# Patient Record
Sex: Male | Born: 1985 | Race: White | Hispanic: No | Marital: Married | Smoking: Former smoker
Health system: Southern US, Community
[De-identification: ages and names within clinical notes are randomized; demographics above are authoritative.]

## PROBLEM LIST (undated history)

## (undated) DIAGNOSIS — R569 Unspecified convulsions: Secondary | ICD-10-CM

## (undated) DIAGNOSIS — M549 Dorsalgia, unspecified: Secondary | ICD-10-CM

## (undated) DIAGNOSIS — G8929 Other chronic pain: Secondary | ICD-10-CM

## (undated) DIAGNOSIS — F419 Anxiety disorder, unspecified: Secondary | ICD-10-CM

## (undated) DIAGNOSIS — Z8619 Personal history of other infectious and parasitic diseases: Secondary | ICD-10-CM

## (undated) HISTORY — DX: Other chronic pain: G89.29

## (undated) HISTORY — DX: Dorsalgia, unspecified: M54.9

## (undated) HISTORY — DX: Personal history of other infectious and parasitic diseases: Z86.19

## (undated) HISTORY — DX: Anxiety disorder, unspecified: F41.9

## (undated) HISTORY — DX: Unspecified convulsions: R56.9

---

## 2013-09-26 ENCOUNTER — Emergency Department (HOSPITAL_COMMUNITY): Admission: EM | Admit: 2013-09-26 | Discharge: 2013-09-26 | Discharge status: Against Medical Advice | Payer: Self-pay

## 2014-10-30 ENCOUNTER — Other Ambulatory Visit (HOSPITAL_COMMUNITY): Payer: Self-pay | Admitting: Gastroenterology

## 2014-10-30 DIAGNOSIS — B182 Chronic viral hepatitis C: Secondary | ICD-10-CM

## 2014-11-08 ENCOUNTER — Ambulatory Visit (HOSPITAL_COMMUNITY): Payer: Self-pay

## 2014-11-14 ENCOUNTER — Ambulatory Visit (HOSPITAL_COMMUNITY): Payer: Self-pay

## 2014-11-26 ENCOUNTER — Ambulatory Visit (HOSPITAL_COMMUNITY)
Admission: RE | Admit: 2014-11-26 | Discharge: 2014-11-26 | Disposition: A | Discharge status: Home / Self Care | Payer: Managed Care, Other (non HMO) | Source: Ambulatory Visit | Attending: Gastroenterology | Admitting: Gastroenterology

## 2014-11-26 DIAGNOSIS — B182 Chronic viral hepatitis C: Secondary | ICD-10-CM | POA: Diagnosis present

## 2015-10-29 ENCOUNTER — Ambulatory Visit (INDEPENDENT_AMBULATORY_CARE_PROVIDER_SITE_OTHER): Payer: BLUE CROSS/BLUE SHIELD | Admitting: Neurology

## 2015-10-29 ENCOUNTER — Encounter: Payer: Self-pay | Admitting: Neurology

## 2015-10-29 ENCOUNTER — Ambulatory Visit: Payer: BLUE CROSS/BLUE SHIELD | Admitting: Neurology

## 2015-10-29 VITALS — BP 133/96 | HR 80 | Ht 70.0 in | Wt 188.0 lb

## 2015-10-29 DIAGNOSIS — Z8669 Personal history of other diseases of the nervous system and sense organs: Secondary | ICD-10-CM

## 2015-10-29 DIAGNOSIS — Z87898 Personal history of other specified conditions: Secondary | ICD-10-CM | POA: Insufficient documentation
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
## 2015-11-05 ENCOUNTER — Ambulatory Visit (INDEPENDENT_AMBULATORY_CARE_PROVIDER_SITE_OTHER): Payer: BLUE CROSS/BLUE SHIELD | Admitting: Neurology

## 2015-11-05 DIAGNOSIS — G4089 Other seizures: Secondary | ICD-10-CM

## 2015-11-05 DIAGNOSIS — Z87898 Personal history of other specified conditions: Secondary | ICD-10-CM

## 2015-11-14 ENCOUNTER — Telehealth: Payer: Self-pay | Admitting: Neurology

## 2015-11-14 NOTE — Telephone Encounter (Signed)
Patient called to request results of recent EEG

## 2015-11-14 NOTE — Procedures (Signed)
   HISTORY: 29 years old male, with history of seizure   TECHNIQUE:  16 channel EEG was performed based on standard 10-16 international system. One channel was dedicated to EKG, which has demonstrates normal sinus rhythm of  84 beats per minutes.  Upon awakening, the posterior background activity was well-developed, in alpha range,  9 Hz,  reactive to eye opening and closure.  There was no evidence of epileptiform discharge.  Photic stimulation was performed, which induced a symmetric photic driving.  Hyperventilation was performed, there was no abnormality elicit.  Stage II  sleep was achieved, as evident by K complexes, and sleep spindles.   CONCLUSION: This is a  normal  awake and asleep EEG.  There is no electrodiagnostic evidence of epileptiform discharge

## 2015-11-14 NOTE — Telephone Encounter (Signed)
Please call patient for normal EEG 

## 2015-11-14 NOTE — Telephone Encounter (Signed)
I called pt and relayed that the EEG is normal study.  He verbalized understanding.

## 2015-12-20 IMAGING — US US ABDOMEN COMPLETE W/ ELASTOGRAPHY
1 series · 13 of 25 positions shown · non-contrast
Comparison: None.

CLINICAL DATA: Chronic hepatitis-C without coma.



[Series 1: us abdomen complete w/ elastography · 0.27mm/px · 13 of 70 slices shown]
[im 1/70]
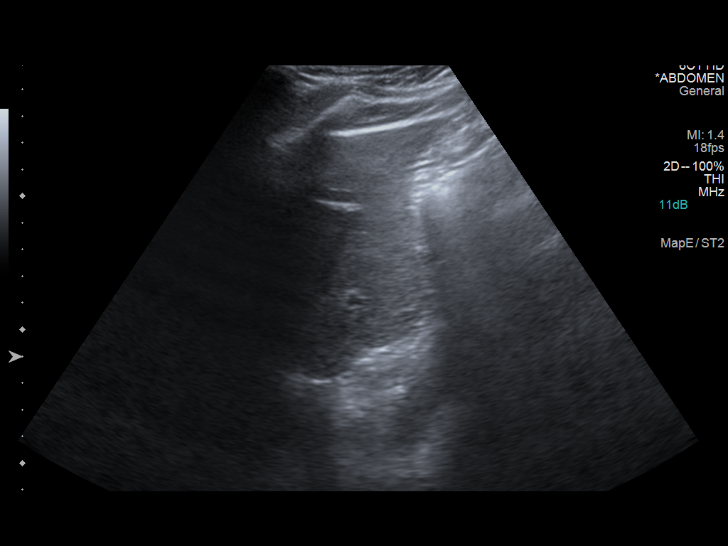
[im 6/70]
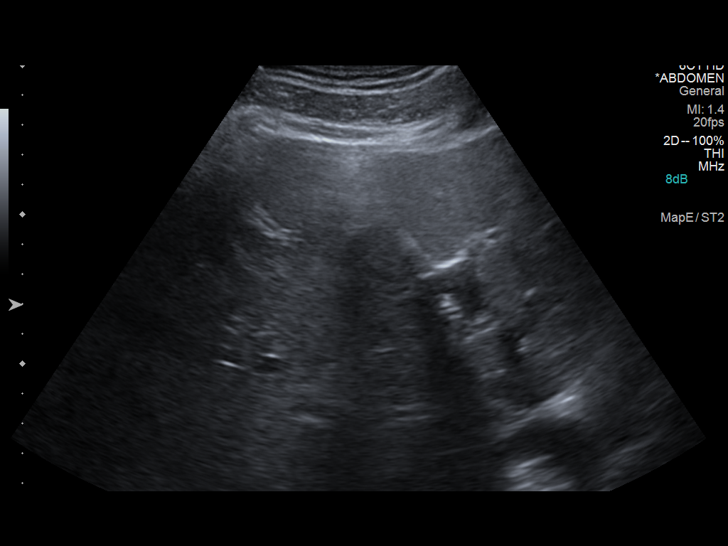
[im 12/70]
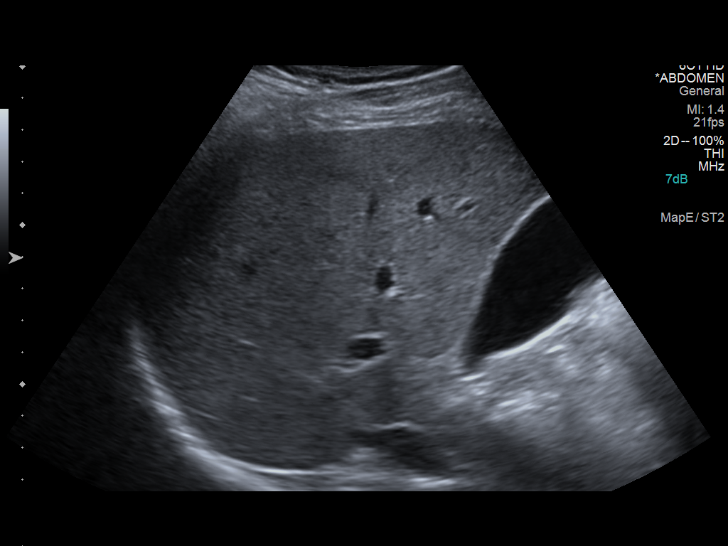
[im 18/70]
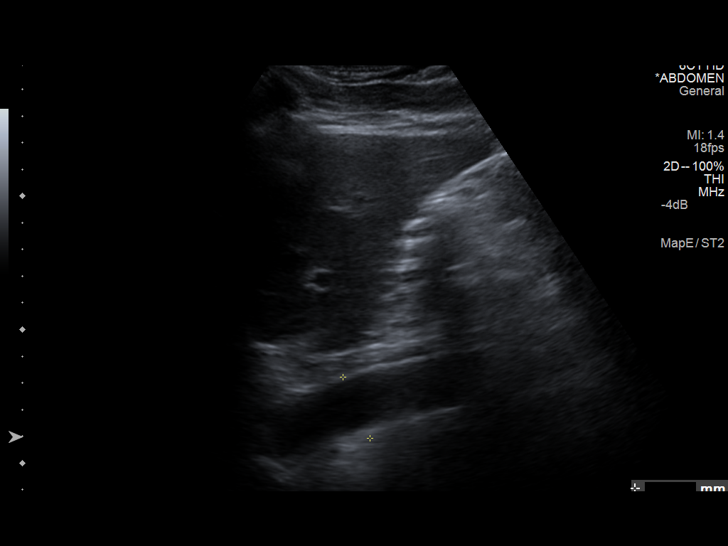
[im 24/70]
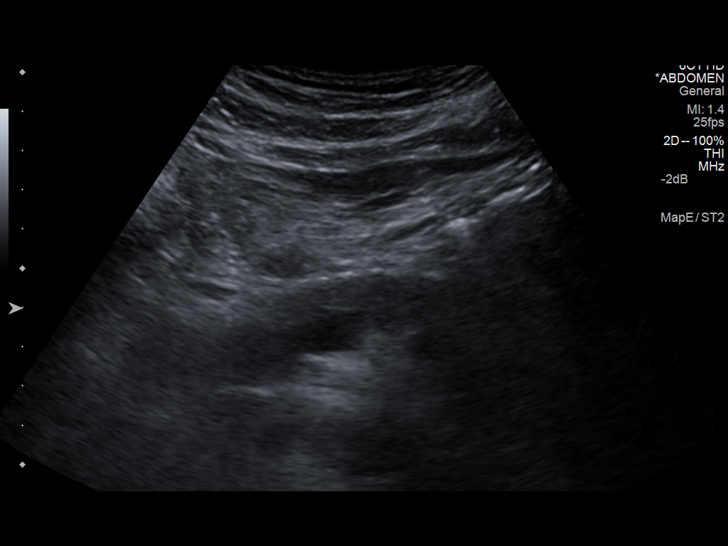
[im 29/70]
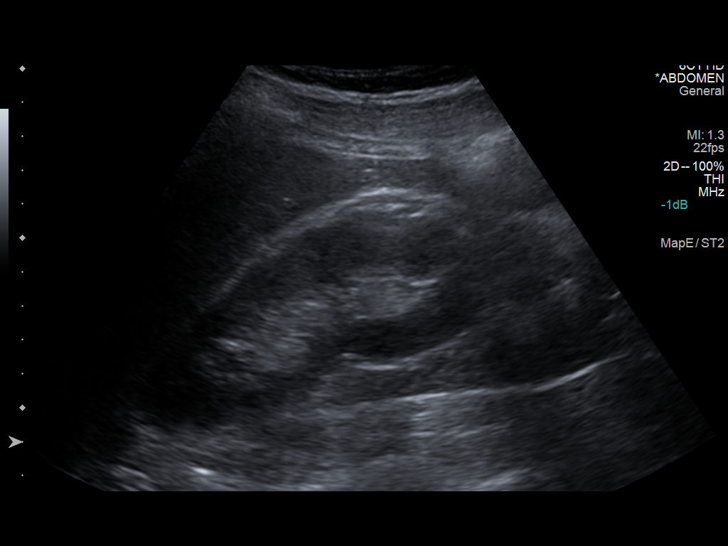
[im 35/70]
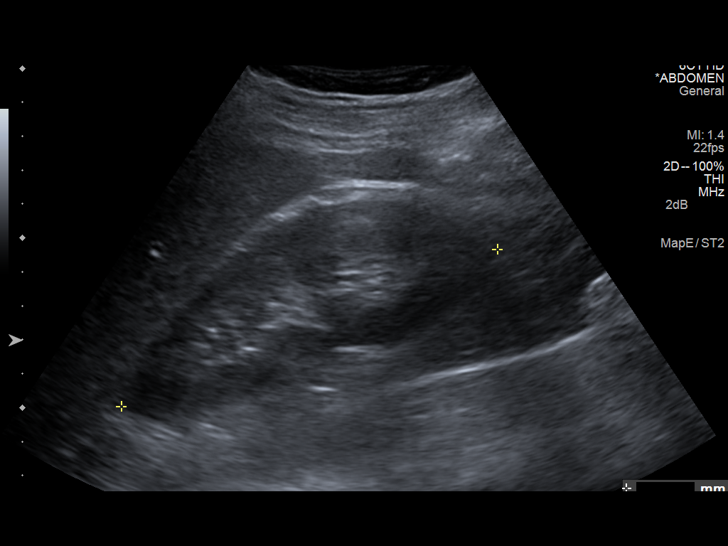
[im 41/70]
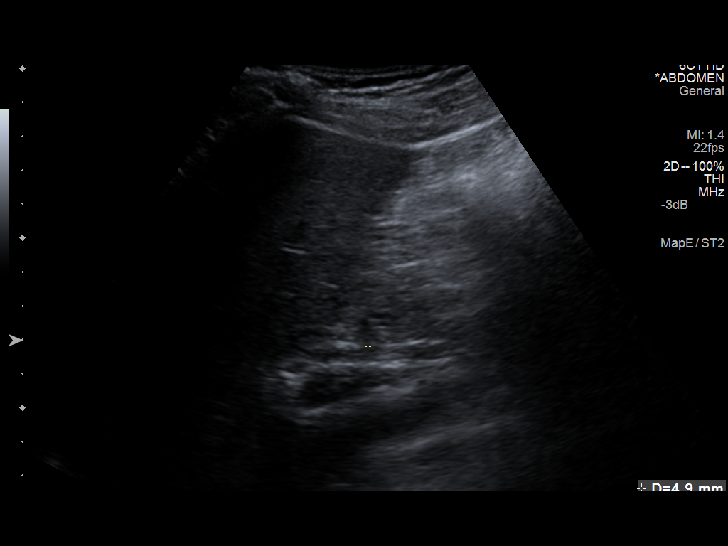
[im 47/70]
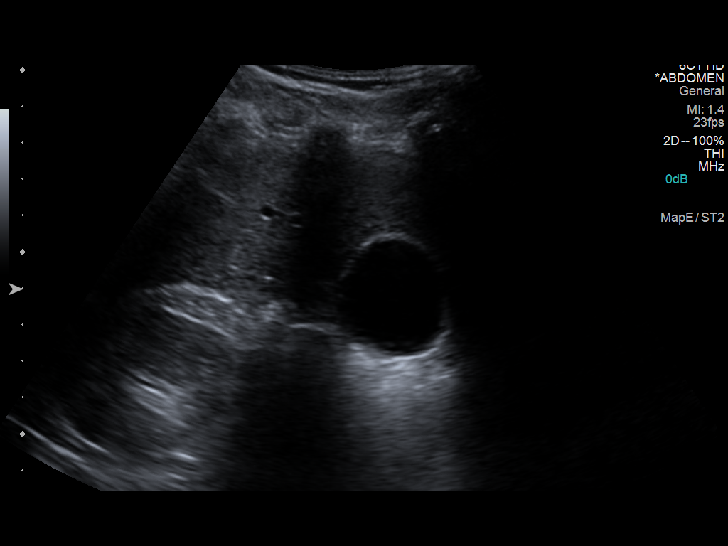
[im 52/70]
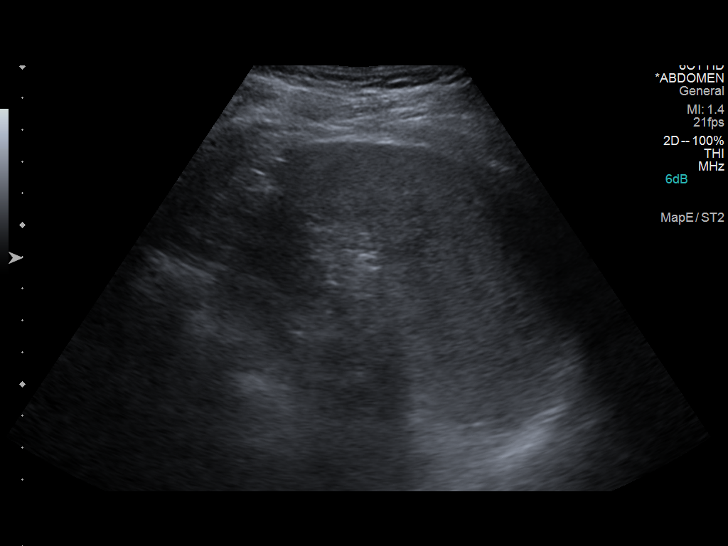
[im 58/70]
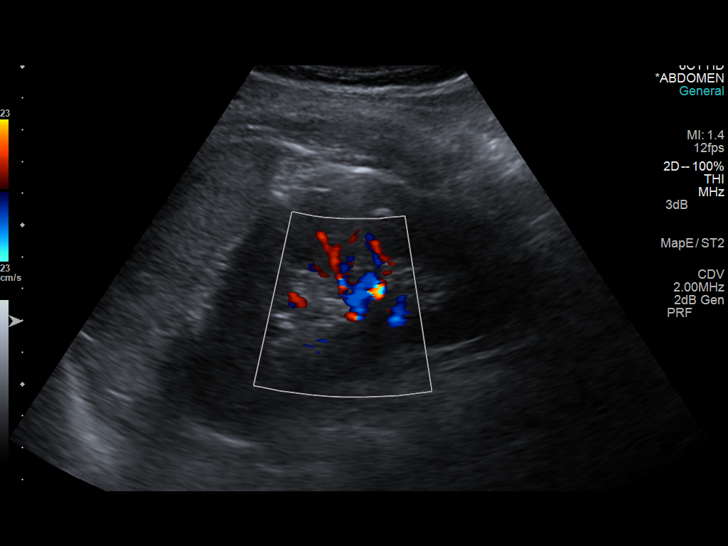
[im 64/70]
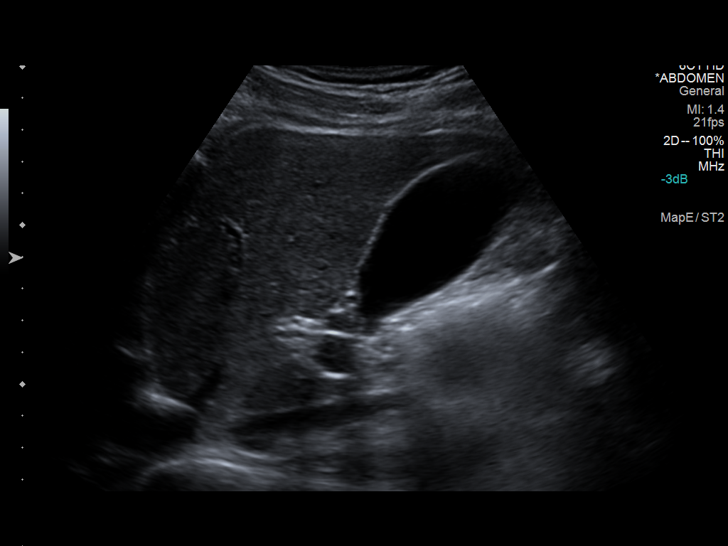
[im 70/70]
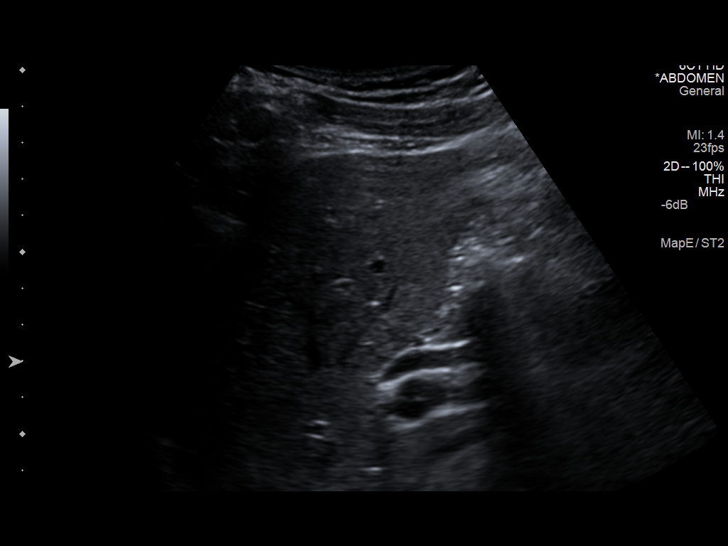

[13 of 25 positions shown; findings below may reference images not displayed]

FINDINGS: ULTRASOUND ABDOMEN

Gallbladder: No gallstones or wall thickening visualized. No
sonographic Murphy sign noted.

Common bile duct: Diameter: 5 mm

Liver: No focal lesion identified. Within normal limits in
parenchymal echogenicity.

IVC: No abnormality visualized.

Pancreas: Visualized portion unremarkable.

Spleen: Size and appearance within normal limits.

Right Kidney: Length: 12.0 cm. Echogenicity within normal limits. No
mass or hydronephrosis visualized.

Left Kidney: Length: 12.8 cm. Echogenicity within normal limits. No
mass or hydronephrosis visualized.

Abdominal aorta: No aneurysm visualized.

Other findings: None.

ULTRASOUND HEPATIC ELASTOGRAPHY

Device: Siemens Helix VTQ

Transducer 4V

Patient position: Left lateral decubitus

Number of measurements:  10

Hepatic Segment:  8

Median velocity:   1.14  m/sec

IQR:

IQR/Median velocity ratio

Corresponding Metavir fibrosis score:  F0/F1

Risk of fibrosis: Minimal

Limitations of exam: None

Pertinent findings noted on other imaging exams:  None

Please note that abnormal shear wave velocities may also be
identified in clinical settings other than with hepatic fibrosis,
such as: acute hepatitis, elevated right heart and central venous
pressures including use of beta blockers, Md Abdal disease
(Zeinab), infiltrative processes such as
mastocytosis/amyloidosis/infiltrative tumor, extrahepatic
cholestasis, in the post-prandial state, and liver transplantation.
Correlation with patient history, laboratory data, and clinical
condition recommended.
IMPRESSION: Negative abdominal ultrasound.

Median hepatic shear wave velocity is calculated at 1.14 m/sec.

Corresponding Metavir fibrosis score is F0/F1.

Risk of fibrosis is minimal.

Follow-up:  None required

## 2016-01-28 ENCOUNTER — Ambulatory Visit: Payer: BLUE CROSS/BLUE SHIELD | Admitting: Neurology

## 2016-01-29 ENCOUNTER — Encounter: Payer: Self-pay | Admitting: Neurology

## 2016-06-24 DIAGNOSIS — F419 Anxiety disorder, unspecified: Secondary | ICD-10-CM | POA: Diagnosis not present

## 2016-06-24 DIAGNOSIS — B1920 Unspecified viral hepatitis C without hepatic coma: Secondary | ICD-10-CM | POA: Diagnosis not present

## 2016-06-24 DIAGNOSIS — K219 Gastro-esophageal reflux disease without esophagitis: Secondary | ICD-10-CM | POA: Diagnosis not present

## 2016-12-07 DIAGNOSIS — Z0000 Encounter for general adult medical examination without abnormal findings: Secondary | ICD-10-CM | POA: Diagnosis not present

## 2017-05-28 DIAGNOSIS — K219 Gastro-esophageal reflux disease without esophagitis: Secondary | ICD-10-CM | POA: Diagnosis not present

## 2017-05-28 DIAGNOSIS — F329 Major depressive disorder, single episode, unspecified: Secondary | ICD-10-CM | POA: Diagnosis not present

## 2017-11-02 DIAGNOSIS — H6501 Acute serous otitis media, right ear: Secondary | ICD-10-CM | POA: Diagnosis not present

## 2017-11-04 DIAGNOSIS — H60331 Swimmer's ear, right ear: Secondary | ICD-10-CM | POA: Diagnosis not present

## 2017-12-23 DIAGNOSIS — Z0000 Encounter for general adult medical examination without abnormal findings: Secondary | ICD-10-CM | POA: Diagnosis not present

## 2017-12-23 DIAGNOSIS — F329 Major depressive disorder, single episode, unspecified: Secondary | ICD-10-CM | POA: Diagnosis not present

## 2017-12-23 DIAGNOSIS — K219 Gastro-esophageal reflux disease without esophagitis: Secondary | ICD-10-CM | POA: Diagnosis not present

## 2017-12-23 DIAGNOSIS — B1920 Unspecified viral hepatitis C without hepatic coma: Secondary | ICD-10-CM | POA: Diagnosis not present

## 2018-01-19 DIAGNOSIS — Z0000 Encounter for general adult medical examination without abnormal findings: Secondary | ICD-10-CM | POA: Diagnosis not present

## 2018-01-19 DIAGNOSIS — B1920 Unspecified viral hepatitis C without hepatic coma: Secondary | ICD-10-CM | POA: Diagnosis not present

## 2018-05-16 DIAGNOSIS — F3170 Bipolar disorder, currently in remission, most recent episode unspecified: Secondary | ICD-10-CM | POA: Diagnosis not present

## 2018-05-31 DIAGNOSIS — F3170 Bipolar disorder, currently in remission, most recent episode unspecified: Secondary | ICD-10-CM | POA: Diagnosis not present

## 2018-06-07 DIAGNOSIS — F3170 Bipolar disorder, currently in remission, most recent episode unspecified: Secondary | ICD-10-CM | POA: Diagnosis not present

## 2018-08-02 DIAGNOSIS — F430 Acute stress reaction: Secondary | ICD-10-CM | POA: Diagnosis not present

## 2018-09-14 DIAGNOSIS — F430 Acute stress reaction: Secondary | ICD-10-CM | POA: Diagnosis not present

## 2019-01-19 DIAGNOSIS — Z13220 Encounter for screening for lipoid disorders: Secondary | ICD-10-CM | POA: Diagnosis not present

## 2019-01-19 DIAGNOSIS — L299 Pruritus, unspecified: Secondary | ICD-10-CM | POA: Diagnosis not present

## 2019-01-31 ENCOUNTER — Encounter: Payer: Self-pay | Admitting: Allergy & Immunology

## 2019-01-31 ENCOUNTER — Ambulatory Visit: Payer: BLUE CROSS/BLUE SHIELD | Admitting: Allergy & Immunology

## 2019-01-31 VITALS — BP 142/106 | HR 92 | Temp 98.1°F | Resp 16 | Ht 70.0 in | Wt 207.6 lb

## 2019-01-31 DIAGNOSIS — L299 Pruritus, unspecified: Secondary | ICD-10-CM | POA: Diagnosis not present

## 2019-01-31 DIAGNOSIS — R21 Rash and other nonspecific skin eruption: Secondary | ICD-10-CM | POA: Diagnosis not present

## 2019-01-31 MED ORDER — CRISABOROLE 2 % EX OINT: 1.0000 "application " | TOPICAL_OINTMENT | Freq: Two times a day (BID) | CUTANEOUS | 5 refills | Status: AC

## 2019-01-31 NOTE — Patient Instructions (Addendum)
1. Rash - We are going to get some environmental allergy labs to see if there is an allergic trigger for your symptoms. - We are also going to make sure that you do not have overactive mast cells.  - I do not think that this is related to foods, but take a good history of dietary exposures to see if this worsens your symptoms at all.  - Continue with moisturizing twice daily. - Add on Eucrisa twice daily as needed for the itching.  - Consider patch testing to see if there are other chemical exposures that might worsen your skin. - Double Zyrtec (cetirizine) to one tablet twice daily.   2. Return in about 6 days (around 02/06/2019) for [facility] TESTING.   Please inform [place] of any Emergency Department visits, hospitalizations, or changes in symptoms. Call us before going to the ED for breathing or allergy symptoms since we might be able to fit you in for a sick visit. Feel free to contact us anytime with any questions, problems, or concerns.  It was a pleasure to meet you today!  Websites that have reliable patient information: 1. American Academy of Asthma, Allergy, and Immunology: www.aaaai.org 2. Food Allergy Research and Education (FARE): foodallergy.org 3. Mothers of Asthmatics: http://www.asthmacommunitynetwork.org 4. American College of Allergy, Asthma, and Immunology: MissingWeapons.ca   Make sure you are registered to vote! If you have moved or changed any of your contact information, you will need to get this updated before voting!    Voter ID laws are NOT going into effect for the General Election in November 2020! DO NOT let this stop you from exercising your right to vote!     True Test looks for the following sensitivities:

## 2019-01-31 NOTE — Addendum Note (Signed)
Addended by: Osa Craver on: 01/31/2019 11:15 AM   Modules accepted: Orders

## 2019-01-31 NOTE — Progress Notes (Signed)
NEW PATIENT  Date of Service/Encounter:  01/31/19  Referring provider: Jarrett Soho, PA-C   Assessment:   Rash and itching - unclear trigger   It is unclear what is causing Steve Morgan rash.  I do not suspect that this is an allergic rash, since he has absolutely no other symptoms of allergic rhinitis or atopic disease at all.  We are going to get an environmental allergy panel via the blood to see what could be triggering the symptoms.  We are also going to get a serum tryptase to rule out a mast cell disease.  I think something like contact dermatitis is more likely, although he has never had vesicles, so I think patch testing would be more useful for him.  He is going to get this done next Monday with Dr. Selena Batten.  I did discuss patch testing, including the fact that he should not have prednisone within several weeks of the test.  He has not used prednisone at all for this current rash.  We are going to treat the itch with Eucrisa and increased antihistamines.    Plan/Recommendations:   1. Rash - We are going to get some environmental allergy labs to see if there is an allergic trigger for your symptoms. - We are also going to make sure that you do not have overactive mast cells.  - I do not think that this is related to foods, but take a good history of dietary exposures to see if this worsens your symptoms at all.  - Continue with moisturizing twice daily. - Add on Eucrisa twice daily as needed for the itching.  - Consider patch testing to see if there are other chemical exposures that might worsen your skin. - Double Zyrtec (cetirizine) to one tablet twice daily.   2. Return in about 6 days (around 02/06/2019) for [facility] TESTING.   Subjective:   Steve Morgan is a 33 y.o. male presenting today for evaluation of  Chief Complaint  Patient presents with  . Rash    used moisturizer  . Pruritis    Steve Morgan has a history of the following: Patient Active Problem  List   Diagnosis Date Noted  . History of seizure 10/29/2015    History obtained from: chart review and patient.  Earley Brooke was referred by Jarrett Soho, PA-C.     Jemell is a 33 y.o. male presenting for an evaluation of a rash. Her reports that he has a rash on his legs. He gets dry skin during the winter but this year has been much worse. His wife switched to Gain laundry detergent. He thinks that this is what triggered the response. Then they changed laundry detergent to Oxyclean without dyes. He rewashed this all and then this is when things rapidly worsened. He feels that it is an "internal itch". He has used lotions, mositurizing, etc. His PCP recommended cetirizine which did help somewhat. There has never been a visible rash for the most part, aside from dry flaky skin. He would have some red dots with certain areas of scratching.    He was treated for hepatitis C four years ago and received a curative treatment for this.  The total was $58,000, but thankfully he had insurance.  He does have a recent complete metabolic panel 2 weeks ago, which was completely normal.  He also had a normal CBC and normal thyroid studies.  He does not think that the rash gets any worse with any particular  food.  He has not changed his diet at all.  He tolerates all of the major food allergens without adverse event.  He denies any hives, throat swelling, gastrointestinal symptoms, or other symptoms with any particular food.  He denies any tick bites.   Otherwise, there is no history of other atopic diseases, including asthma, food allergies, drug allergies, environmental allergies, stinging insect allergies or contact dermatitis. There is no significant infectious history. Vaccinations are up to date.    Past Medical History: Patient Active Problem List   Diagnosis Date Noted  . History of seizure 10/29/2015    Medication List:  Allergies as of 01/31/2019   No Known Allergies     Medication  List       Accurate as of January 31, 2019  9:23 AM. Always use your most recent med list.        cetirizine 10 MG tablet Commonly known as:  ZYRTEC Take 10 mg by mouth as needed for allergies.   gabapentin 800 MG tablet Commonly known as:  NEURONTIN 1 tablet Three times a day Orally 90 days   ibuprofen 200 MG tablet Commonly known as:  ADVIL,MOTRIN Take 200 mg by mouth as needed.   omeprazole 20 MG capsule Commonly known as:  PRILOSEC TAKE ONE CAPSULE BY MOUTH EVERY DAY FOR 90 DAYS       Birth History: non-contributory  Developmental History: non-contributory.   Past Surgical History: History reviewed. No pertinent surgical history.   Family History: Family History  Problem Relation Age of Onset  . Alcoholism Mother   . Drug abuse Father   . Heart disease Father   . Alcoholism Maternal Grandfather   . Heart attack Paternal Grandfather   . Diabetes Maternal Grandmother   . Drug abuse Brother      Social History: Nyjel lives at home with his family. He lives in a house that was built in 1968. There are hardwoods in the main living areas. He has carpeting in the rest of the home. There is gas heating and central cooling.  There are no animals inside or outside of the home.  He does not have dust mite covers on his bedding.  There is no tobacco exposure.  Currently he works for a [employer] as a Merchandiser, retail.  He has done this for 18 months.  He did smoke from 2005 through 2014, but no longer smokes at all.   Review of Systems  Constitutional: Negative.  Negative for fever, malaise/fatigue and weight loss.  HENT: Negative.  Negative for congestion, ear discharge and ear pain.   Eyes: Negative for pain, discharge and redness.  Respiratory: Negative for cough, sputum production, shortness of breath and wheezing.   Cardiovascular: Negative.  Negative for chest pain and palpitations.  Gastrointestinal: Negative for abdominal pain and heartburn.  Skin: Positive for itching  and rash.  Neurological: Negative for dizziness and headaches.  Endo/Heme/Allergies: Negative for environmental allergies. Does not bruise/bleed easily.       Objective:   Blood pressure (!) 142/106, pulse 92, temperature 98.1 F (36.7 C), temperature source Oral, resp. rate 16, height 5\' 10"  (1.778 m), weight 207 lb 9.6 oz (94.2 kg), SpO2 96 %. Body mass index is 29.79 kg/m.   Physical Exam:   Physical Exam  Constitutional: He appears well-developed.  HENT:  Head: Normocephalic and atraumatic.  Right Ear: Tympanic membrane, external ear and ear canal normal. No drainage, swelling or tenderness. Tympanic membrane is not injected, not scarred, not erythematous, not  retracted and not bulging.  Left Ear: Tympanic membrane, external ear and ear canal normal. No drainage, swelling or tenderness. Tympanic membrane is not injected, not scarred, not erythematous, not retracted and not bulging.  Nose: No mucosal edema, rhinorrhea, nasal deformity or septal deviation. No epistaxis. Right sinus exhibits no maxillary sinus tenderness and no frontal sinus tenderness. Left sinus exhibits no maxillary sinus tenderness and no frontal sinus tenderness.  Mouth/Throat: Uvula is midline and oropharynx is clear and moist. Mucous membranes are not pale and not dry.  Eyes: Pupils are equal, round, and reactive to light. Conjunctivae and EOM are normal. Right eye exhibits no chemosis and no discharge. Left eye exhibits no chemosis and no discharge. Right conjunctiva is not injected. Left conjunctiva is not injected.  Cardiovascular: Normal rate, regular rhythm and normal heart sounds.  Respiratory: Effort normal and breath sounds normal. No accessory muscle usage. No tachypnea. No respiratory distress. He has no wheezes. He has no rhonchi. He has no rales. He exhibits no tenderness.  GI: There is no abdominal tenderness. There is no rebound and no guarding.  Lymphadenopathy:       Head (right side): No  submandibular, no tonsillar and no occipital adenopathy present.       Head (left side): No submandibular, no tonsillar and no occipital adenopathy present.    He has no cervical adenopathy.  Neurological: He is alert.  Skin: No abrasion, no petechiae and no rash noted. Rash is not papular, not vesicular and not urticarial. No erythema. No pallor.  There are some excoriations on the bilateral legs as well as the arms.  Psychiatric: He has a normal mood and affect.     Diagnostic studies: none (deferred since he had an appointment)       Malachi Bonds, MD [facility]

## 2019-02-04 LAB — IGE+ALLERGENS ZONE 2(30)
AMER SYCAMORE IGE QN: 0.12 kU/L — AB
Aspergillus Fumigatus IgE: <0.1 kU/L
Bahia Grass IgE: 2.34 kU/L — AB
Cladosporium Herbarum IgE: <0.1 kU/L
Cockroach, American IgE: 0.13 kU/L — AB
Common Silver Birch IgE: <0.1 kU/L
D001-IGE D PTERONYSSINUS: 0.41 kU/L — AB
D002-IGE D FARINAE: 0.49 kU/L — AB
Dog Dander IgE: <0.1 kU/L
Elm, American IgE: <0.1 kU/L
G002-IGE BERMUDA GRASS: 1.39 kU/L — AB
Hickory, White IgE: <0.1 kU/L
IGE (IMMUNOGLOBULIN E), SERUM: 361 [IU]/mL (ref 6–495)
Johnson Grass IgE: 2.27 kU/L — AB
Maple/Box Elder IgE: 1.69 kU/L — AB
Mucor Racemosus IgE: <0.1 kU/L
Mugwort IgE Qn: <0.1 kU/L
Nettle IgE: <0.1 kU/L
Oak, White IgE: 0.11 kU/L — AB
Penicillium Chrysogen IgE: <0.1 kU/L
Pigweed, Rough IgE: <0.1 kU/L
Plantain, English IgE: 0.13 kU/L — AB
Sheep Sorrel IgE Qn: 0.13 kU/L — AB
T006-IGE CEDAR, MOUNTAIN: 0.1 kU/L — AB
Timothy Grass IgE: 4.17 kU/L — AB
W001-IGE RAGWEED, SHORT: 0.39 kU/L — AB
White Mulberry IgE: <0.1 kU/L

## 2019-02-04 LAB — TRYPTASE: Tryptase: 5.3 ug/L (ref 2.2–13.2)

## 2019-02-06 ENCOUNTER — Ambulatory Visit (INDEPENDENT_AMBULATORY_CARE_PROVIDER_SITE_OTHER): Payer: BLUE CROSS/BLUE SHIELD | Admitting: Allergy

## 2019-02-06 ENCOUNTER — Encounter: Payer: Self-pay | Admitting: Allergy

## 2019-02-06 VITALS — BP 130/82 | HR 76 | Resp 16

## 2019-02-06 DIAGNOSIS — L239 Allergic contact dermatitis, unspecified cause: Secondary | ICD-10-CM

## 2019-02-06 NOTE — Assessment & Plan Note (Addendum)
Rash and pruritus without any rhino conjunctivitis symptoms. TRUE test patches placed today.

## 2019-02-06 NOTE — Progress Notes (Signed)
   Follow Up Note  RE: Steve Morgan MRN: [id number] DOB: 1986/11/10 Date of Office Visit: 02/06/2019  Referring provider: Jarrett Soho, PA-C Primary care provider: Jarrett Soho, PA-C  History of Present Illness: I had the pleasure of seeing Steve Morgan for a follow up visit at the [facility] on 02/06/2019. He is a 33 y.o. male, who is being followed for pruritus and rash. Today he is here for patch test placement, given suspected history of contact dermatitis.   Diagnostics: TRUE Test patches placed.   Assessment and Plan: Steve Morgan is a 33 y.o. male with: Allergic contact dermatitis Rash and pruritus without any rhino conjunctivitis symptoms. TRUE test patches placed today.   The patient was instructed regarding proper care of the patches for the next 48 hours. Do not get patches wet - avoid showering until the next visit. Do not engage in vigorous physical activity.  Patient will follow up in 48 hours and 96 hours for patch readings.  It was my pleasure to see Steve Morgan today and participate in his care. Please feel free to contact me with any questions or concerns.  Sincerely,  Wyline Mood, DO Allergy & Immunology  [facility] office: [phone] [facility] office:[phone]

## 2019-02-08 ENCOUNTER — Ambulatory Visit: Payer: BLUE CROSS/BLUE SHIELD | Admitting: Allergy

## 2019-02-08 ENCOUNTER — Encounter: Payer: Self-pay | Admitting: Allergy

## 2019-02-08 ENCOUNTER — Other Ambulatory Visit: Payer: Self-pay

## 2019-02-08 DIAGNOSIS — L239 Allergic contact dermatitis, unspecified cause: Secondary | ICD-10-CM

## 2019-02-08 NOTE — Progress Notes (Signed)
   Follow Up Note  RE: Steve Morgan MRN: [id number] DOB: 1986/05/26 Date of Office Visit: 02/08/2019  Referring provider: Jarrett Soho, PA-C Primary care provider: Jarrett Soho, PA-C  History of Present Illness: I had the pleasure of seeing Steve Morgan for a follow up visit at the [facility] on 02/08/2019. He is a 33 y.o. male, who is being followed for pruritus and rash. Today he is here for initial patch test interpretation, given suspected history of contact dermatitis.   Diagnostics:  TRUE TEST 48 hour reading:  T.R.U.E. Test - 02/08/19 0900    Time Antigen Placed  0930    Manufacturer  Greer    Location  Back    Number of Test  36    Reading Interval  Day 1;Day 3    Panel  Panel 1;Panel 2;Panel 3    1. Nickel Sulfate  --   +/-   2. Wool Alcohols  0    3. Neomycin Sulfate  0    4. Potassium Dichromate  0    5. Caine Mix  0    6. Fragrance Mix  0    7. Colophony  --   +/-   8. Paraben Mix  0    9. Negative Control  0    10. Balsam of [place]  0    11. Ethylenediamine Dihydrochloride  0    12. Cobalt Dichloride  0    13. p-tert Butylphenol Formaldehyde Resin  0    14. Epoxy Resin  0    15. Carba Mix  0    16.  Black Rubber Mix  0    17. Cl+ Me-Isothiazolinone  0    18. Quaternium-15  0    19. Methyldibromo Glutaronitrile  0    20. p-Phenylenediamine  0    21. Formaldehyde  0    22. Mercapto Mix  0    23. Thimerosal  0    24. Thiuram Mix  0    25. Diazolidinyl Urea  0    26. Quinoline Mix  1    27. Tixocortol-21-Pivalate  0    28. Gold Sodium Thiosulfate  0    29. Imidazolidinyl Urea  0    30. Budesonide  0    31. Hydrocortisone-17-Butyrate  0    32. Mercaptobenzothiazole  0    33. Bacitracin  0    34. Parthenolide  0    35. Disperse Blue 106  0    36. 2-Bromo-2-Nitropropane-1,3-diol  0        Assessment and Plan: Steve Morgan is a 33 y.o. male with: Allergic contact dermatitis Rash and pruritus without any rhino conjunctivitis  symptoms.   48 hour reading was borderline positive to nickel sulfate, colophony and +1 to quinoline mix.  Return in about 2 days (around 02/10/2019) for Patch reading.  It was my pleasure to see Steve Morgan today and participate in his care. Please feel free to contact me with any questions or concerns.  Sincerely,  Wyline Mood, DO Allergy & Immunology  [facility] office: [phone] [facility] office:[phone]

## 2019-02-08 NOTE — Assessment & Plan Note (Signed)
Rash and pruritus without any rhino conjunctivitis symptoms.   48 hour reading was borderline positive to nickel sulfate, colophony and +1 to quinoline mix.

## 2019-02-10 ENCOUNTER — Ambulatory Visit: Payer: BLUE CROSS/BLUE SHIELD | Admitting: Allergy & Immunology

## 2019-02-10 ENCOUNTER — Other Ambulatory Visit: Payer: Self-pay

## 2019-02-10 ENCOUNTER — Encounter: Payer: Self-pay | Admitting: Allergy & Immunology

## 2019-02-10 DIAGNOSIS — L239 Allergic contact dermatitis, unspecified cause: Secondary | ICD-10-CM

## 2019-02-10 DIAGNOSIS — R21 Rash and other nonspecific skin eruption: Secondary | ICD-10-CM

## 2019-02-10 NOTE — Progress Notes (Signed)
    Follow-up Note  RE: Steve Morgan MRN: [id number] DOB: 1986/06/12 Date of Office Visit: 02/10/2019  Primary care provider: Jarrett Soho, PA-C Referring provider: Jarrett Soho, Steve Morgan returns to the office today for the final patch test interpretation, given suspected history of contact dermatitis.    Diagnostics:   TRUE TEST 96-hour hour reading: equivocal reaction to #1 (Nickel Sulfate), equivocal reaction to #7 (Colophony), equivocal reaction to #19 (Methyldibromo Glutaronitrile), equivocal reaction to #24 (Thiuram mix), equivocal reaction to #26 (Quinoline mix), equivocal reaction to #31 (Hydrocortisone-17-butyrate) and equivocal reaction to #34 (Parthenolide).  Plan:   Allergic contact dermatitis - The patient has been provided detailed information regarding the substances she is sensitive to, as well as products containing the substances.   - Meticulous avoidance of these substances is recommended.  - If avoidance is not possible, the use of barrier creams or lotions is recommended. - If symptoms persist or progress despite meticulous avoidance of the above chemicals, Dermatology Referral may be warranted.   Malachi Bonds, MD  [facility]

## 2019-02-12 ENCOUNTER — Encounter: Payer: Self-pay | Admitting: Allergy & Immunology

## 2019-02-16 DIAGNOSIS — R238 Other skin changes: Secondary | ICD-10-CM | POA: Diagnosis not present

## 2019-02-16 DIAGNOSIS — B1920 Unspecified viral hepatitis C without hepatic coma: Secondary | ICD-10-CM | POA: Diagnosis not present

## 2019-03-09 DIAGNOSIS — Z03818 Encounter for observation for suspected exposure to other biological agents ruled out: Secondary | ICD-10-CM | POA: Diagnosis not present

## 2019-07-10 DIAGNOSIS — Z1159 Encounter for screening for other viral diseases: Secondary | ICD-10-CM | POA: Diagnosis not present

## 2019-07-11 DIAGNOSIS — Z1159 Encounter for screening for other viral diseases: Secondary | ICD-10-CM | POA: Diagnosis not present

## 2019-09-04 DIAGNOSIS — Z20828 Contact with and (suspected) exposure to other viral communicable diseases: Secondary | ICD-10-CM | POA: Diagnosis not present

## 2019-12-06 DIAGNOSIS — Z0000 Encounter for general adult medical examination without abnormal findings: Secondary | ICD-10-CM | POA: Diagnosis not present

## 2019-12-06 DIAGNOSIS — Z13220 Encounter for screening for lipoid disorders: Secondary | ICD-10-CM | POA: Diagnosis not present

## 2019-12-21 DIAGNOSIS — Z03818 Encounter for observation for suspected exposure to other biological agents ruled out: Secondary | ICD-10-CM | POA: Diagnosis not present

## 2019-12-21 DIAGNOSIS — Z20828 Contact with and (suspected) exposure to other viral communicable diseases: Secondary | ICD-10-CM | POA: Diagnosis not present

## 2020-01-20 DIAGNOSIS — Z20828 Contact with and (suspected) exposure to other viral communicable diseases: Secondary | ICD-10-CM | POA: Diagnosis not present

## 2020-01-20 DIAGNOSIS — Z03818 Encounter for observation for suspected exposure to other biological agents ruled out: Secondary | ICD-10-CM | POA: Diagnosis not present

## 2020-05-22 DIAGNOSIS — F329 Major depressive disorder, single episode, unspecified: Secondary | ICD-10-CM | POA: Diagnosis not present

## 2020-05-22 DIAGNOSIS — R229 Localized swelling, mass and lump, unspecified: Secondary | ICD-10-CM | POA: Diagnosis not present

## 2020-05-22 DIAGNOSIS — K649 Unspecified hemorrhoids: Secondary | ICD-10-CM | POA: Diagnosis not present

## 2020-05-22 DIAGNOSIS — F419 Anxiety disorder, unspecified: Secondary | ICD-10-CM | POA: Diagnosis not present

## 2020-12-10 DIAGNOSIS — Z0000 Encounter for general adult medical examination without abnormal findings: Secondary | ICD-10-CM | POA: Diagnosis not present

## 2020-12-10 DIAGNOSIS — L918 Other hypertrophic disorders of the skin: Secondary | ICD-10-CM | POA: Diagnosis not present

## 2020-12-10 DIAGNOSIS — Z13220 Encounter for screening for lipoid disorders: Secondary | ICD-10-CM | POA: Diagnosis not present

## 2020-12-10 DIAGNOSIS — K219 Gastro-esophageal reflux disease without esophagitis: Secondary | ICD-10-CM | POA: Diagnosis not present

## 2020-12-10 DIAGNOSIS — Z8619 Personal history of other infectious and parasitic diseases: Secondary | ICD-10-CM | POA: Diagnosis not present

## 2020-12-10 DIAGNOSIS — F419 Anxiety disorder, unspecified: Secondary | ICD-10-CM | POA: Diagnosis not present

## 2020-12-12 DIAGNOSIS — K648 Other hemorrhoids: Secondary | ICD-10-CM | POA: Diagnosis not present

## 2020-12-12 DIAGNOSIS — K219 Gastro-esophageal reflux disease without esophagitis: Secondary | ICD-10-CM | POA: Diagnosis not present

## 2021-01-16 DIAGNOSIS — R52 Pain, unspecified: Secondary | ICD-10-CM | POA: Diagnosis not present

## 2021-01-16 DIAGNOSIS — R059 Cough, unspecified: Secondary | ICD-10-CM | POA: Diagnosis not present

## 2021-01-16 DIAGNOSIS — J069 Acute upper respiratory infection, unspecified: Secondary | ICD-10-CM | POA: Diagnosis not present

## 2021-01-16 DIAGNOSIS — Z20822 Contact with and (suspected) exposure to covid-19: Secondary | ICD-10-CM | POA: Diagnosis not present

## 2021-01-16 DIAGNOSIS — Z03818 Encounter for observation for suspected exposure to other biological agents ruled out: Secondary | ICD-10-CM | POA: Diagnosis not present

## 2021-01-16 DIAGNOSIS — R509 Fever, unspecified: Secondary | ICD-10-CM | POA: Diagnosis not present

## 2021-02-26 DIAGNOSIS — D485 Neoplasm of uncertain behavior of skin: Secondary | ICD-10-CM | POA: Diagnosis not present

## 2021-04-08 DIAGNOSIS — L720 Epidermal cyst: Secondary | ICD-10-CM | POA: Diagnosis not present

## 2021-06-05 DIAGNOSIS — H6090 Unspecified otitis externa, unspecified ear: Secondary | ICD-10-CM | POA: Diagnosis not present

## 2021-12-11 DIAGNOSIS — L739 Follicular disorder, unspecified: Secondary | ICD-10-CM | POA: Diagnosis not present

## 2021-12-11 DIAGNOSIS — Z13220 Encounter for screening for lipoid disorders: Secondary | ICD-10-CM | POA: Diagnosis not present

## 2021-12-11 DIAGNOSIS — R350 Frequency of micturition: Secondary | ICD-10-CM | POA: Diagnosis not present

## 2021-12-11 DIAGNOSIS — Z8619 Personal history of other infectious and parasitic diseases: Secondary | ICD-10-CM | POA: Diagnosis not present

## 2021-12-11 DIAGNOSIS — Z0000 Encounter for general adult medical examination without abnormal findings: Secondary | ICD-10-CM | POA: Diagnosis not present

## 2021-12-26 DIAGNOSIS — R945 Abnormal results of liver function studies: Secondary | ICD-10-CM | POA: Diagnosis not present

## 2021-12-30 ENCOUNTER — Other Ambulatory Visit: Payer: Self-pay | Admitting: Family Medicine

## 2021-12-30 DIAGNOSIS — R7989 Other specified abnormal findings of blood chemistry: Secondary | ICD-10-CM

## 2022-01-01 ENCOUNTER — Encounter: Payer: Self-pay | Admitting: *Deleted

## 2022-01-12 ENCOUNTER — Other Ambulatory Visit: Payer: Managed Care, Other (non HMO)

## 2022-02-18 ENCOUNTER — Ambulatory Visit (INDEPENDENT_AMBULATORY_CARE_PROVIDER_SITE_OTHER): Payer: BC Managed Care – PPO | Admitting: Cardiovascular Disease

## 2022-02-18 ENCOUNTER — Other Ambulatory Visit: Payer: Self-pay

## 2022-02-18 ENCOUNTER — Encounter: Payer: Self-pay | Admitting: Cardiovascular Disease

## 2022-02-18 VITALS — BP 112/78 | HR 98 | Ht 70.0 in | Wt 198.6 lb

## 2022-02-18 DIAGNOSIS — E785 Hyperlipidemia, unspecified: Secondary | ICD-10-CM | POA: Insufficient documentation

## 2022-02-18 DIAGNOSIS — Z8249 Family history of ischemic heart disease and other diseases of the circulatory system: Secondary | ICD-10-CM

## 2022-02-18 DIAGNOSIS — R748 Abnormal levels of other serum enzymes: Secondary | ICD-10-CM

## 2022-02-18 NOTE — Progress Notes (Signed)
Cardiology Office Note:    Date:  02/18/2022   ID:  Steve Morgan, DOB 29-Jul-1986, MRN [id number]  PCP:  Jarrett Soho, PA-C   [facility] Providers Cardiologist:  Thurmon Fair, MD     Referring MD: Jarrett Soho, PA-C   Chief Complaint  Patient presents with   Hyperlipidemia  Steve Morgan is a 36 y.o. male who is being seen today for the evaluation of dyslipidemia at the request of Jarrett Soho, [place].   History of Present Illness:    Steve Morgan is a 36 y.o. male with a hx of familial dyslipidemia and a very strong family history of premature coronary artery disease, who is referred to follow-up for severely depressed HDL cholesterol.  Steve Morgan is very physically active.  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  He has no cardiovascular complaints and can perform intense activity without dyspnea or angina.  He denies a history of palpitations, dizziness, syncope, focal neurological complaints, claudication or other cardiovascular complaints.  He is concerned because his paternal grandfather, father and 2 paternal uncles all had coronary disease around the age of 8.  They all had myocardial infarctions.  His grandfather also has a history of aortic valve disease and aortic aneurysm, pulmonary fibrosis and pulmonary artery hypertension.  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  He has a history of substance abuse including cocaine and methamphetamines, but has been sober for the last 10 years and is very aware of the risks of substance abuse.  He does not drink alcohol.  He has a minor history of smoking (9 pack years, quit in 2015, although he does started vaping again last year.  He has a history of hepatitis C treated with Harvoni and has been viral load negative for the last 5 years.  He does have abnormal  transaminases and is seeing Dr. Dulce Sellar at [facility] gastroenterology.  Use some testosterone supplements last summer, but is not using any at this time.  He has never used injectable anabolic steroids.  His lipid profile was remarkable for extremely low HDL cholesterol of only 15 (total cholesterol 183, triglycerides 110, LDL 147).  He reports that about a year ago his HDL was better at 40, but he has always had a low HDL cholesterol.  He also has abnormal transaminases.  On January 12 AST was 68 and ALT 113, repeat labs on January 27 showed AST 100 and ALT 265.  Alkaline phosphatase and total bilirubin are normal as is the albumin.  He has a history of chronic back pain but avoids taking analgesics other than ibuprofen.  He is on chronic gabapentin.  His long-term girlfriend is a Surveyor, mining in a Cytogeneticist.  She encouraged him to have a coronary calcium score.  Past Medical History:  Diagnosis Date   Anxiety    Chronic back pain    Hx of hepatitis C    Seizure (HCC)     History reviewed. No pertinent surgical history.  No history of previous major surgeries.  Current Medications: Current Meds  Medication Sig   gabapentin (NEURONTIN) 600 MG tablet Take 600 mg by mouth 4 (four) times daily.   ibuprofen (ADVIL,MOTRIN) 200 MG tablet Take 200 mg by mouth as needed.   omeprazole (PRILOSEC) 20 MG capsule TAKE ONE CAPSULE BY  MOUTH EVERY DAY FOR 90 DAYS     Allergies:   Patient has no known allergies.   Social History   Socioeconomic History   Marital status: Married    Spouse name: Not on file   Number of children: 0   Years of education: HS   Highest education level: Not on file  Occupational History   Occupation: Clinical cytogeneticist  Tobacco Use   Smoking status: Former    Packs/day: 1.00    Years: 9.00    Pack years: 9.00    Types: Cigarettes   Smokeless tobacco: Current   Tobacco comments:    Started back vaping 01/2021    Quit July 2015  Vaping  Use   Vaping Use: Never used  Substance and Sexual Activity   Alcohol use: No    Alcohol/week: 0.0 standard drinks    Comment: Quit 2011 - previous history of abuse   Drug use: No    Comment: Quit May 2014 - previous history of abuse   Sexual activity: Not on file  Other Topics Concern   Not on file  Social History Narrative   Lives at home with his wife.   Right-handed.   4-6 sodas per day.   Social Determinants of Health   Financial Resource Strain: Not on file  Food Insecurity: Not on file  Transportation Needs: Not on file  Physical Activity: Not on file  Stress: Not on file  Social Connections: Not on file     Family History: The patient's family history includes Alcoholism in his maternal grandfather and mother; Diabetes in his maternal grandmother; Drug abuse in his brother and father; Heart attack in his paternal grandfather; Heart disease in his father.  ROS:   Please see the history of present illness.     All other systems reviewed and are negative.  EKGs/Labs/Other Studies Reviewed:    The following studies were reviewed today: Notes and labs from primary care provider.  EKG:  EKG is ordered today.  The ekg ordered today demonstrates normal sinus rhythm, minor nonspecific T wave changes (flattened T waves in the lateral leads), otherwise normal tracing, QTc 411 ms.  Recent Labs: No results found for requested labs within last 8760 hours.  12/11/2021 Creatinine 1.13, potassium 4.8 AST 68, ALT 113  12/26/2021 AST 100, ALT 265, alkaline phosphatase 70, total bilirubin 1.0, direct bilirubin 0.4, albumin 4.5 Recent Lipid Panel No results found for: CHOL, TRIG, HDL, CHOLHDL, VLDL, LDLCALC, LDLDIRECT 12/11/2021 Cholesterol 183, triglycerides 110, LDL 147, HDL 15  Risk Assessment/Calculations:           Physical Exam:    VS:  BP 112/78   Pulse 98   Ht 5\' 10"  (1.778 m)   Wt 198 lb 9.6 oz (90.1 kg)   SpO2 98%   BMI 28.50 kg/m     Wt Readings from  Last 3 Encounters:  02/18/22 198 lb 9.6 oz (90.1 kg)  01/31/19 207 lb 9.6 oz (94.2 kg)  10/29/15 188 lb (85.3 kg)     GEN: Very fit, athletic and muscular; well nourished, well developed in no acute distress HEENT: Normal NECK: No JVD; No carotid bruits LYMPHATICS: No lymphadenopathy CARDIAC: RRR, no murmurs, rubs, gallops RESPIRATORY:  Clear to auscultation without rales, wheezing or rhonchi  ABDOMEN: Soft, non-tender, non-distended MUSCULOSKELETAL:  No edema; No deformity  SKIN: Warm and dry NEUROLOGIC:  Alert and oriented x 3 PSYCHIATRIC:  Normal affect   ASSESSMENT:    1. Dyslipidemia   2. Family  history of coronary artery disease younger than 36 years of age   37. Abnormal transaminases    PLAN:    In order of problems listed above:  Dyslipidemia: This is likely hereditary.   He is very fit and lean.  His BMI overestimates body fat content since he is very muscular and exercises 5 or 6 hours a week.  His waistline is only 32 inches.  I really do not think we can ask him to exercise more intensely, although would be be advisable for him to add some cardio/aerobic activity.  I would not recommend alcohol in view of his previous history of substance use.  We reviewed the fact that we do not have any effective treatment for low HDL cholesterol that improves outcomes.  His LDL is relatively high and he could benefit from statin therapy, but I think we want to clarify the cause for his abnormal LFTs first. Family history of CAD: Strong family history of premature CAD in multiple first-degree and second-degree male family members.  He is appropriately very concerned about his personal risk for the development of CAD complications.  He specifically asked about a calcium score.  We discussed the fact that calcium scores have not been validated for patients younger than 31, but I share his concern.  I pointed out that if his calcium score is 0 that does not mean that his risk is low, in view  of his young age.  Any coronary calcium at his age would signify increased risk and indicate a need to aggressively address his risk factors. Abnormal transaminases: He has a history of hepatitis C, but his viral load has been negative after treatment with Harvoni, for 5 years now.  He has a follow-up appointment with a gastroenterologist, Dr. Dulce Sellar at [facility], and just a few days.  Would like for him to clarify whether there is active ongoing liver disease and whether it would be safe to prescribe statins.           Medication Adjustments/Labs and Tests Ordered: Current medicines are reviewed at length with the patient today.  Concerns regarding medicines are outlined above.  Orders Placed This Encounter  Procedures   CT CARDIAC SCORING (SELF PAY ONLY)   Lipid panel   EKG 12-Lead   No orders of the defined types were placed in this encounter.   Patient Instructions  Medication Instructions:  No changes *If you need a refill on your cardiac medications before your next appointment, please call your pharmacy*   Lab Work: Your provider would like for you to return in June to have the following labs drawn: fasting Lipid. You do not need an appointment for the lab. Once in our office lobby there is a podium where you can sign in and ring the doorbell to alert [place] that you are here. The lab is open from 8:00 am to 4 pm; closed for lunch from 12:45pm-1:45pm.  You may also go to any of these [facility] locations:   [facility] - [address] ([facility]) - [address] [phone] [address]   [place] - [address]    [place]  - [address]    [place] - [address] - [address] [employer]  - [address] [place] [address] ([facility]  If you have labs (blood work) drawn today and your  tests are completely normal, you will receive your results only  by: MyChart Message (if you have MyChart) OR A paper copy in the mail If you have any lab test that is abnormal or we need to change your treatment, we will call you to review the results.   Testing/Procedures: Dr. Royann Shivers has ordered a CT coronary calcium score.   Test locations:  [facility] ([address] 3rd Floor [place], [place] [address]) [facility]  ([address] [place], [place] [address])   This is $99 out of pocket.   Coronary CalciumScan A coronary calcium scan is an imaging test used to look for deposits of calcium and other fatty materials (plaques) in the inner lining of the blood vessels of the heart (coronary arteries). These deposits of calcium and plaques can partly clog and narrow the coronary arteries without producing any symptoms or warning signs. This puts a person at risk for a heart attack. This test can detect these deposits before symptoms develop. Tell a health care provider about: Any allergies you have. All medicines you are taking, including vitamins, herbs, eye drops, creams, and over-the-counter medicines. Any problems you or family members have had with anesthetic medicines. Any blood disorders you have. Any surgeries you have had. Any medical conditions you have. Whether you are pregnant or may be pregnant. What are the risks? Generally, this is a safe procedure. However, problems may occur, including: Harm to a pregnant woman and her unborn baby. This test involves the use of radiation. Radiation exposure can be dangerous to a pregnant woman and her unborn baby. If you are pregnant, you generally should not have this procedure done. Slight increase in the risk of cancer. This is because of the radiation involved in the test. What happens before the procedure? No preparation is needed for this procedure. What happens during the procedure? You will undress and remove any jewelry around your neck or chest. You will put on a hospital  gown. Sticky electrodes will be placed on your chest. The electrodes will be connected to an electrocardiogram (ECG) machine to record a tracing of the electrical activity of your heart. A CT scanner will take pictures of your heart. During this time, you will be asked to lie still and hold your breath for 2-3 seconds while a picture of your heart is being taken. The procedure may vary among health care providers and hospitals. What happens after the procedure? You can get dressed. You can return to your normal activities. It is up to you to get the results of your test. Ask your health care provider, or the department that is doing the test, when your results will be ready. Summary A coronary calcium scan is an imaging test used to look for deposits of calcium and other fatty materials (plaques) in the inner lining of the blood vessels of the heart (coronary arteries). Generally, this is a safe procedure. Tell your health care provider if you are pregnant or may be pregnant. No preparation is needed for this procedure. A CT scanner will take pictures of your heart. You can return to your normal activities after the scan is done. This information is not intended to replace advice given to you by your health care provider. Make sure you discuss any questions you have with your health care provider. Document Released: 05/14/2008 Document Revised: 10/05/2016 Document Reviewed: 10/05/2016 Elsevier Interactive Patient Education  2017 [employer].    Follow-Up: At [facility], you and your health needs are our priority.  As part of our continuing mission to provide you with exceptional heart care, we have created designated Provider Care Teams.  These Care Teams include your primary Cardiologist (physician) and Advanced Practice Providers (APPs -  Physician Assistants and Nurse Practitioners) who all work together to provide you with the care you need, when you need it.  We recommend signing up  for the patient portal called "MyChart".  Sign up information is provided on this After Visit Summary.  MyChart is used to connect with patients for Virtual Visits (Telemedicine).  Patients are able to view lab/test results, encounter notes, upcoming appointments, etc.  Non-urgent messages can be sent to your provider as well.   To learn more about what you can do with MyChart, go to [email or web address].    Your next appointment:   12 month(s)  The format for your next appointment:   In Person  Provider:   Thurmon Fair, MD {     Signed, Thurmon Fair, MD  02/18/2022 6:31 PM    [facility]

## 2022-02-18 NOTE — Patient Instructions (Signed)
Medication Instructions:  ?No changes ?*If you need a refill on your cardiac medications before your next appointment, please call your pharmacy* ? ? ?Lab Work: ?Your provider would like for you to return in June to have the following labs drawn: fasting Lipid. You do not need an appointment for the lab. Once in our office lobby there is a podium where you can sign in and ring the doorbell to alert [place] that you are here. The lab is open from 8:00 am to 4 pm; closed for lunch from 12:45pm-1:45pm. ? ?You may also go to any of these [facility] locations: ?  ?[place] ?- [place] ([facility]) ?- [address] ?- [place]  ?[place] ?- [address]  ?  ?[place]  ?- [place] [address]  ?  ?[place] ?- [place]  ?[place]  ?- [place] ?- [place] [address] ([facility] ? ?If you have labs (blood work) drawn today and your tests are completely normal, you will receive your results only by: ?MyChart Message (if you have MyChart) OR ?A paper copy in the mail ?If you have any lab test that is abnormal or we need to change your treatment, we will call you to review the results. ? ? ?Testing/Procedures: ?Dr. Sallyanne Kuster has ordered a CT coronary calcium score.  ? ?Test locations:  ?[facility] ([address] [place], [place] [address]) ?[facility] ([address] [place], [place] [address])  ? ?This is $99 out of pocket. ? ? ?Coronary CalciumScan ?A coronary calcium scan is an imaging test used to look for deposits of calcium and other fatty materials (plaques) in the inner lining of the blood vessels of the heart (coronary arteries). These deposits of calcium and plaques can partly clog and narrow the coronary arteries without producing any symptoms or warning signs. This puts a person at risk for a heart attack. This test can detect these deposits before  symptoms develop. ?Tell a health care provider about: ?Any allergies you have. ?All medicines you are taking, including vitamins, herbs, eye drops, creams, and over-the-counter medicines. ?Any problems you or family members have had with anesthetic medicines. ?Any blood disorders you have. ?Any surgeries you have had. ?Any medical conditions you have. ?Whether you are pregnant or may be pregnant. ?What are the risks? ?Generally, this is a safe procedure. However, problems may occur, including: ?Harm to a pregnant woman and her unborn baby. This test involves the use of radiation. Radiation exposure can be dangerous to a pregnant woman and her unborn baby. If you are pregnant, you generally should not have this procedure done. ?Slight increase in the risk of cancer. This is because of the radiation involved in the test. ?What happens before the procedure? ?No preparation is needed for this procedure. ?What happens during the procedure? ?You will undress and remove any jewelry around your neck or chest. ?You will put on a hospital gown. ?Sticky electrodes will be placed on your chest. The electrodes will be connected to an electrocardiogram (ECG) machine to record a tracing of the electrical activity of your heart. ?A CT scanner will take pictures of your heart. During this time, you will be asked to lie still and hold your breath for 2-3 seconds while a picture of your heart is being taken. ?The procedure may vary among health care providers  and hospitals. ?What happens after the procedure? ?You can get dressed. ?You can return to your normal activities. ?It is up to you to get the results of your test. Ask your health care provider, or the department that is doing the test, when your results will be ready. ?Summary ?A coronary calcium scan is an imaging test used to look for deposits of calcium and other fatty materials (plaques) in the inner lining of the blood vessels of the heart (coronary arteries). ?Generally,  this is a safe procedure. Tell your health care provider if you are pregnant or may be pregnant. ?No preparation is needed for this procedure. ?A CT scanner will take pictures of your heart. ?You can return to your normal activities after the scan is done. ?This information is not intended to replace advice given to you by your health care provider. Make sure you discuss any questions you have with your health care provider. ?Document Released: 05/14/2008 Document Revised: 10/05/2016 Document Reviewed: 10/05/2016 ?Elsevier Interactive Patient Education ? 2017 Elsevier Inc. ? ? ? ?Follow-Up: ?At [facility], you and your health needs are our priority.  As part of our continuing mission to provide you with exceptional heart care, we have created designated Provider Care Teams.  These Care Teams include your primary Cardiologist (physician) and Advanced Practice Providers (APPs -  Physician Assistants and Nurse Practitioners) who all work together to provide you with the care you need, when you need it. ? ?We recommend signing up for the patient portal called "MyChart".  Sign up information is provided on this After Visit Summary.  MyChart is used to connect with patients for Virtual Visits (Telemedicine).  Patients are able to view lab/test results, encounter notes, upcoming appointments, etc.  Non-urgent messages can be sent to your provider as well.   ?To learn more about what you can do with MyChart, go to [email or web address].   ? ?Your next appointment:   ?12 month(s) ? ?The format for your next appointment:   ?In Person ? ?Provider:   ?Sanda Klein, MD { ? ? ?

## 2022-04-02 ENCOUNTER — Ambulatory Visit (INDEPENDENT_AMBULATORY_CARE_PROVIDER_SITE_OTHER)
Admission: RE | Admit: 2022-04-02 | Discharge: 2022-04-02 | Disposition: A | Discharge status: Home / Self Care | Payer: Self-pay | Source: Ambulatory Visit | Attending: Cardiovascular Disease | Admitting: Cardiovascular Disease

## 2022-04-02 DIAGNOSIS — E785 Hyperlipidemia, unspecified: Secondary | ICD-10-CM

## 2022-04-15 ENCOUNTER — Other Ambulatory Visit: Payer: Self-pay | Admitting: Gastroenterology

## 2022-04-15 DIAGNOSIS — R748 Abnormal levels of other serum enzymes: Secondary | ICD-10-CM

## 2022-04-16 ENCOUNTER — Other Ambulatory Visit: Payer: BC Managed Care – PPO

## 2022-04-17 ENCOUNTER — Other Ambulatory Visit: Payer: BC Managed Care – PPO

## 2022-04-20 ENCOUNTER — Ambulatory Visit
Admission: RE | Admit: 2022-04-20 | Discharge: 2022-04-20 | Disposition: A | Discharge status: Home / Self Care | Payer: BC Managed Care – PPO | Source: Ambulatory Visit | Attending: Gastroenterology | Admitting: Gastroenterology

## 2022-04-20 DIAGNOSIS — R748 Abnormal levels of other serum enzymes: Secondary | ICD-10-CM

## 2022-04-20 DIAGNOSIS — R945 Abnormal results of liver function studies: Secondary | ICD-10-CM | POA: Diagnosis not present

## 2022-06-29 DIAGNOSIS — R945 Abnormal results of liver function studies: Secondary | ICD-10-CM | POA: Diagnosis not present

## 2022-06-29 DIAGNOSIS — R748 Abnormal levels of other serum enzymes: Secondary | ICD-10-CM | POA: Diagnosis not present

## 2022-06-29 DIAGNOSIS — B1920 Unspecified viral hepatitis C without hepatic coma: Secondary | ICD-10-CM | POA: Diagnosis not present

## 2022-08-10 DIAGNOSIS — R748 Abnormal levels of other serum enzymes: Secondary | ICD-10-CM | POA: Diagnosis not present

## 2022-12-21 DIAGNOSIS — L739 Follicular disorder, unspecified: Secondary | ICD-10-CM | POA: Diagnosis not present

## 2022-12-21 DIAGNOSIS — R748 Abnormal levels of other serum enzymes: Secondary | ICD-10-CM | POA: Diagnosis not present

## 2022-12-21 DIAGNOSIS — Z13220 Encounter for screening for lipoid disorders: Secondary | ICD-10-CM | POA: Diagnosis not present

## 2022-12-21 DIAGNOSIS — Z0000 Encounter for general adult medical examination without abnormal findings: Secondary | ICD-10-CM | POA: Diagnosis not present

## 2022-12-21 DIAGNOSIS — G4709 Other insomnia: Secondary | ICD-10-CM | POA: Diagnosis not present

## 2022-12-21 DIAGNOSIS — F419 Anxiety disorder, unspecified: Secondary | ICD-10-CM | POA: Diagnosis not present

## 2023-02-24 DIAGNOSIS — R748 Abnormal levels of other serum enzymes: Secondary | ICD-10-CM | POA: Diagnosis not present

## 2023-05-14 IMAGING — US US ABDOMEN LIMITED
1 series · 14 of 25 positions shown · non-contrast
Comparison: None Available.

CLINICAL DATA: Elevated LFTs

EXAM:
ULTRASOUND ABDOMEN LIMITED RIGHT UPPER QUADRANT

[Series 1: us abdomen limited · 0.20mm/px · 14 of 55 slices shown]
[im 1/55]
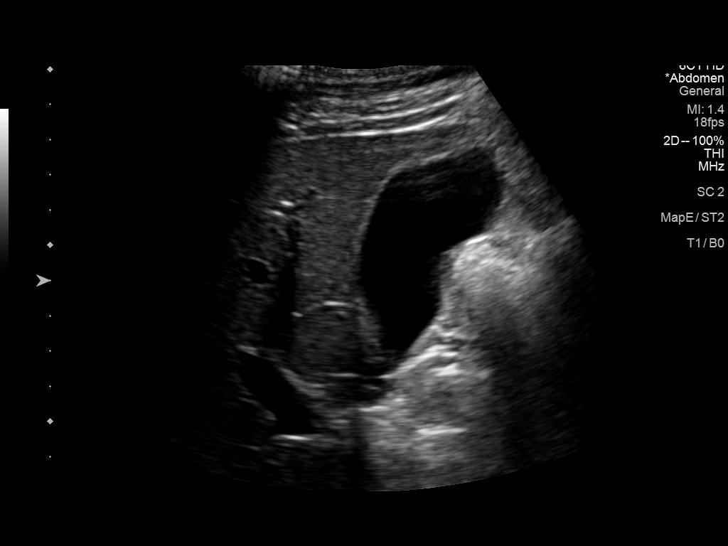
[im 5/55]
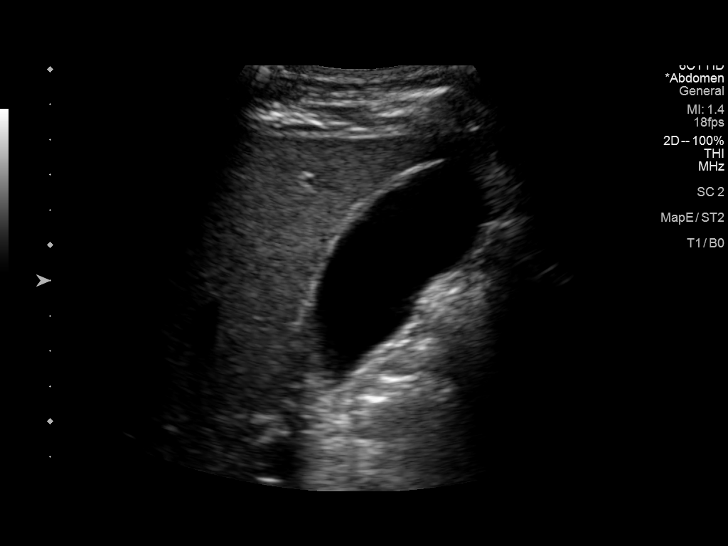
[im 10/55]
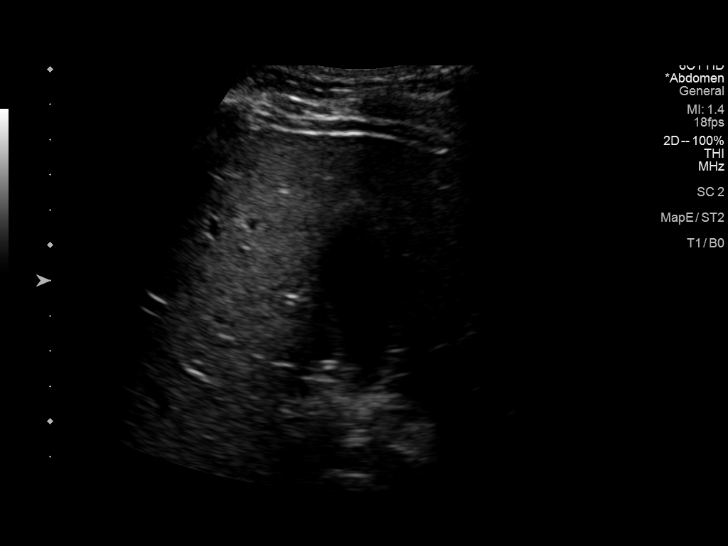
[im 14/55]
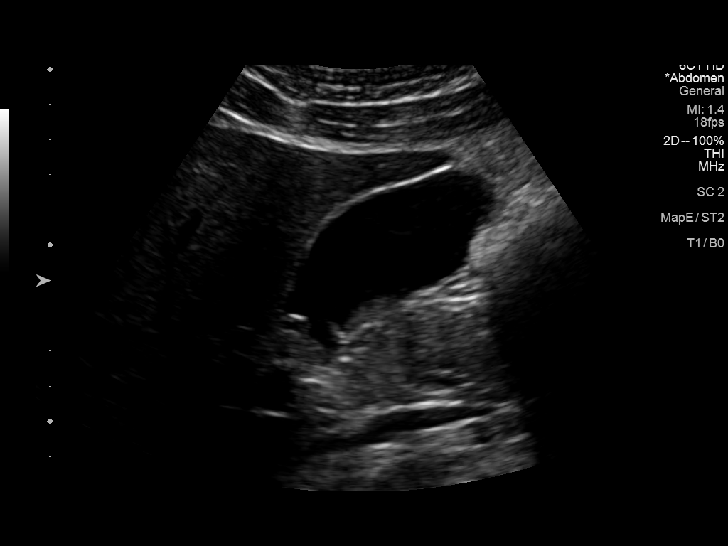
[im 19/55]
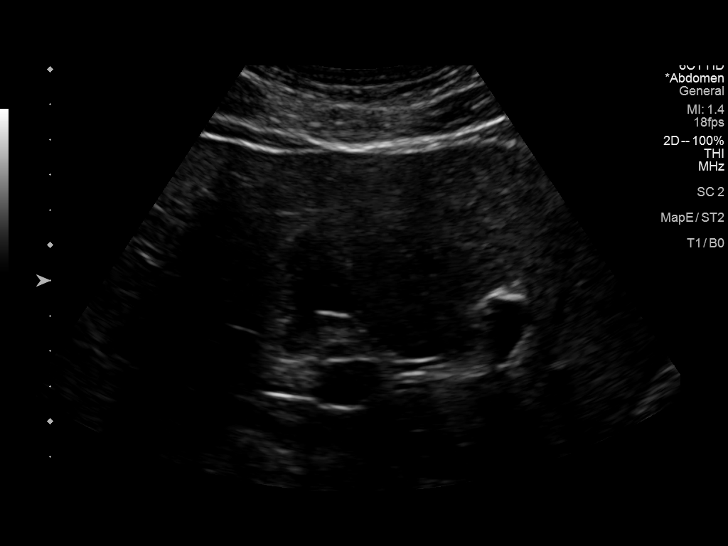
[im 21/55]
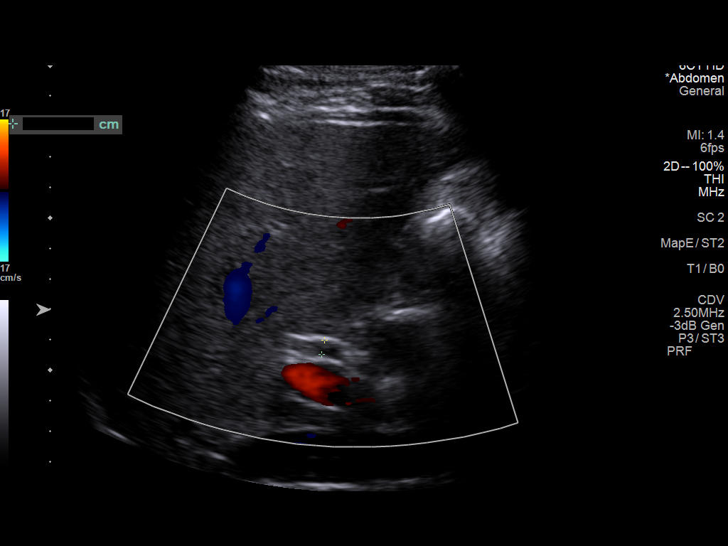
[im 25/55]
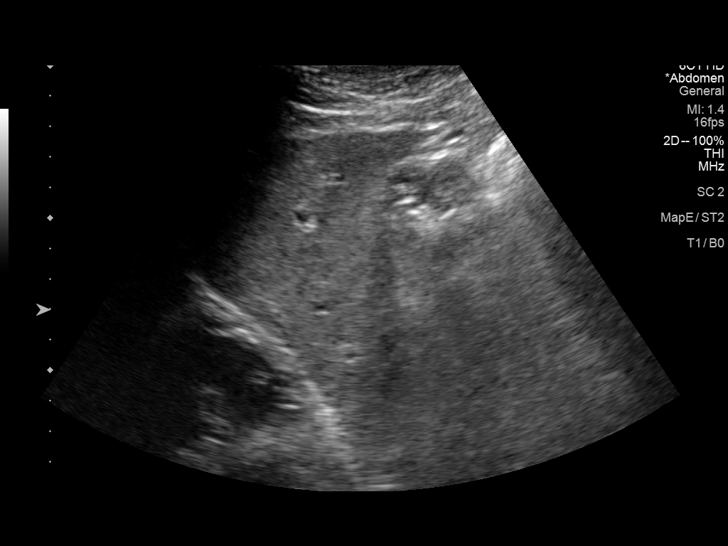
[im 30/55]
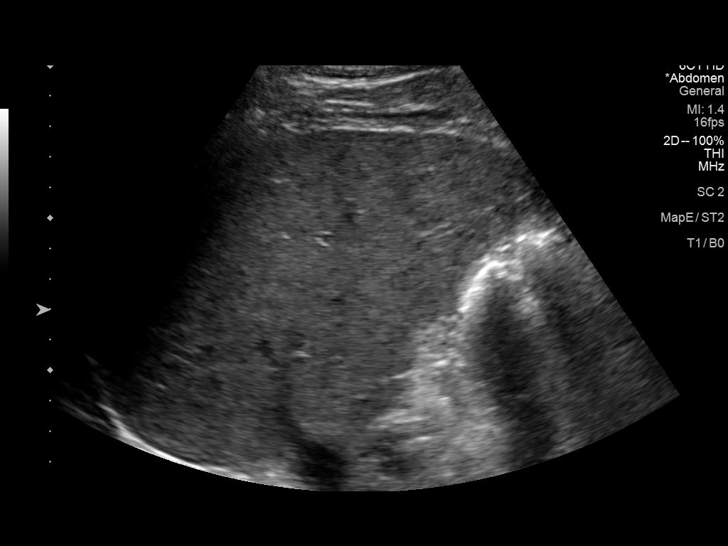
[im 34/55]
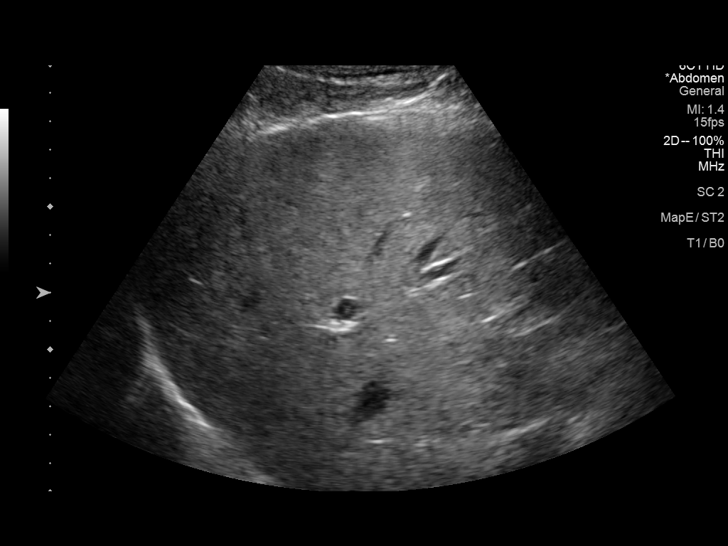
[im 37/55]
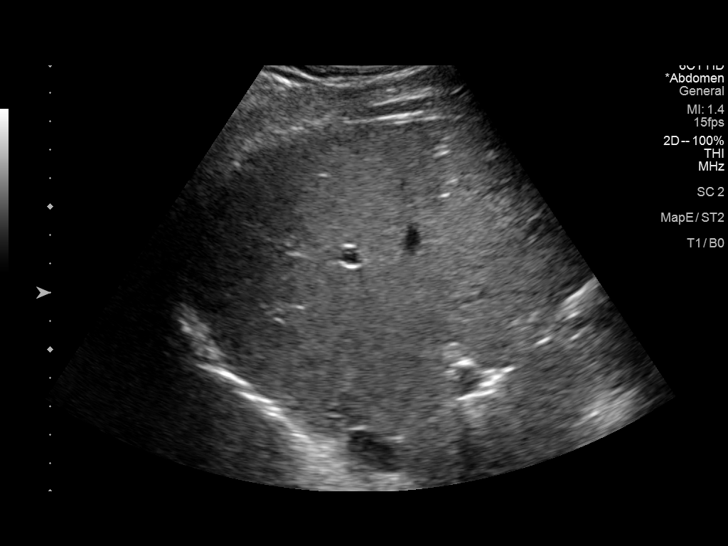
[im 41/55]
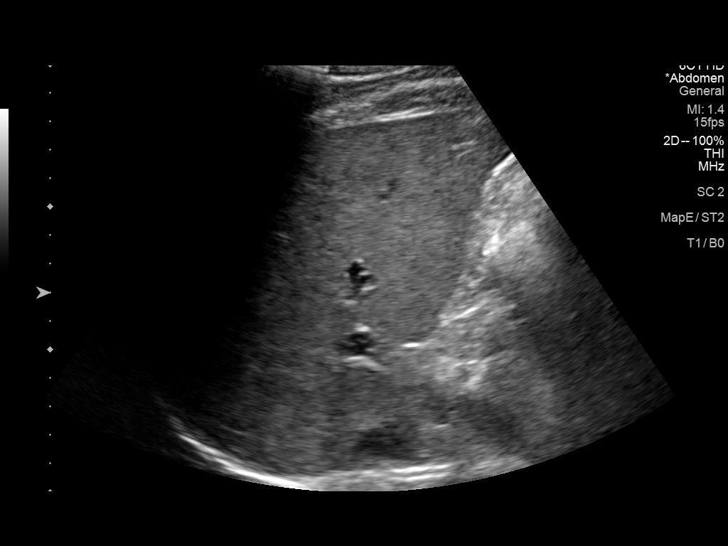
[im 46/55]
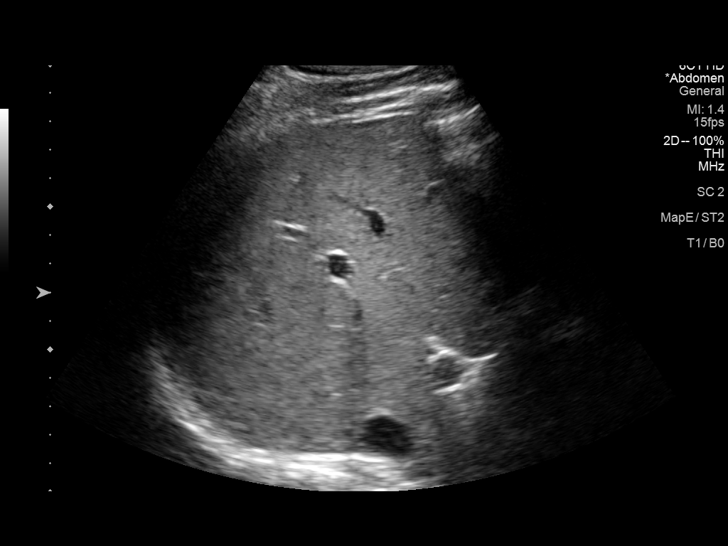
[im 50/55]
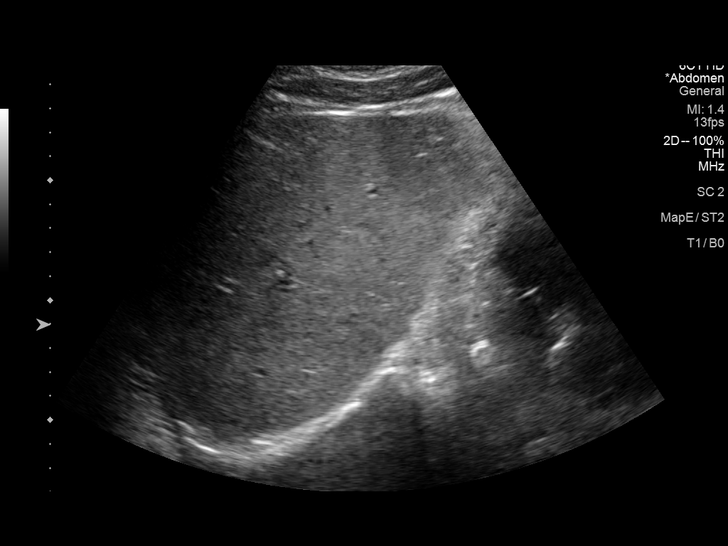
[im 55/55]
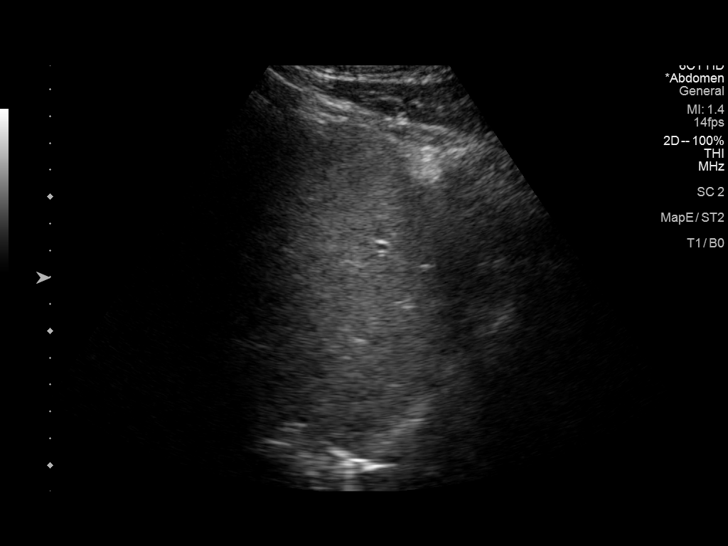

[14 of 25 positions shown; findings below may reference images not displayed]

FINDINGS: Gallbladder:

5 mm echogenic possible calculus identified dependently. No wall
thickening or pericholecystic fluid. No sonographic Murphy sign
noted by sonographer.

Common bile duct:

Diameter: 5 mm

Liver:

No focal lesion identified. Within normal limits in parenchymal
echogenicity. Portal vein is patent on color Doppler imaging with
normal direction of blood flow towards the liver.

Other: None.
IMPRESSION: Small echogenic possible gallstone.  No acute process identified.

## 2023-12-29 DIAGNOSIS — G4709 Other insomnia: Secondary | ICD-10-CM | POA: Diagnosis not present

## 2023-12-29 DIAGNOSIS — Z13220 Encounter for screening for lipoid disorders: Secondary | ICD-10-CM | POA: Diagnosis not present

## 2023-12-29 DIAGNOSIS — Z0000 Encounter for general adult medical examination without abnormal findings: Secondary | ICD-10-CM | POA: Diagnosis not present

## 2023-12-29 DIAGNOSIS — F419 Anxiety disorder, unspecified: Secondary | ICD-10-CM | POA: Diagnosis not present

## 2023-12-29 DIAGNOSIS — Z23 Encounter for immunization: Secondary | ICD-10-CM | POA: Diagnosis not present

## 2023-12-29 DIAGNOSIS — R748 Abnormal levels of other serum enzymes: Secondary | ICD-10-CM | POA: Diagnosis not present

## 2024-03-15 DIAGNOSIS — F330 Major depressive disorder, recurrent, mild: Secondary | ICD-10-CM | POA: Diagnosis not present

## 2024-03-15 DIAGNOSIS — M62830 Muscle spasm of back: Secondary | ICD-10-CM | POA: Diagnosis not present

## 2024-03-15 DIAGNOSIS — M545 Low back pain, unspecified: Secondary | ICD-10-CM | POA: Diagnosis not present

## 2024-03-15 DIAGNOSIS — G8929 Other chronic pain: Secondary | ICD-10-CM | POA: Diagnosis not present

## 2024-03-15 DIAGNOSIS — F419 Anxiety disorder, unspecified: Secondary | ICD-10-CM | POA: Diagnosis not present

## 2024-08-30 DIAGNOSIS — K219 Gastro-esophageal reflux disease without esophagitis: Secondary | ICD-10-CM | POA: Diagnosis not present

## 2024-08-30 DIAGNOSIS — M545 Low back pain, unspecified: Secondary | ICD-10-CM | POA: Diagnosis not present

## 2024-08-30 DIAGNOSIS — B359 Dermatophytosis, unspecified: Secondary | ICD-10-CM | POA: Diagnosis not present

## 2024-08-30 DIAGNOSIS — M62830 Muscle spasm of back: Secondary | ICD-10-CM | POA: Diagnosis not present

## 2024-10-02 DIAGNOSIS — G2581 Restless legs syndrome: Secondary | ICD-10-CM | POA: Diagnosis not present

## 2024-10-02 DIAGNOSIS — F419 Anxiety disorder, unspecified: Secondary | ICD-10-CM | POA: Diagnosis not present
